# Patient Record
Sex: Female | Born: 1960 | Hispanic: Yes | Marital: Single | Smoking: Never smoker
Health system: Southern US, Community
[De-identification: ages and names within clinical notes are randomized; demographics above are authoritative.]

## PROBLEM LIST (undated history)

## (undated) DIAGNOSIS — I10 Essential (primary) hypertension: Secondary | ICD-10-CM

## (undated) HISTORY — DX: Essential (primary) hypertension: I10

---

## 2005-09-11 ENCOUNTER — Ambulatory Visit: Payer: Self-pay

## 2006-08-26 ENCOUNTER — Ambulatory Visit: Payer: Self-pay

## 2006-11-18 HISTORY — PX: BREAST BIOPSY: SHX20

## 2007-09-02 ENCOUNTER — Ambulatory Visit: Payer: Self-pay

## 2007-09-03 ENCOUNTER — Ambulatory Visit: Payer: Self-pay

## 2007-09-04 ENCOUNTER — Ambulatory Visit: Payer: Self-pay | Admitting: Unknown Physician Specialty

## 2007-09-23 ENCOUNTER — Ambulatory Visit: Payer: Self-pay | Admitting: Surgery

## 2008-10-19 ENCOUNTER — Ambulatory Visit: Payer: Self-pay

## 2009-04-19 ENCOUNTER — Ambulatory Visit: Payer: Self-pay

## 2009-10-24 ENCOUNTER — Ambulatory Visit: Payer: Self-pay

## 2010-10-24 ENCOUNTER — Ambulatory Visit: Payer: Self-pay

## 2011-12-04 ENCOUNTER — Ambulatory Visit: Payer: Self-pay

## 2012-12-08 ENCOUNTER — Ambulatory Visit: Payer: Self-pay

## 2013-12-15 ENCOUNTER — Ambulatory Visit: Payer: Self-pay

## 2014-11-18 HISTORY — PX: ABDOMINAL HYSTERECTOMY: SHX81

## 2015-01-18 ENCOUNTER — Ambulatory Visit: Payer: Self-pay

## 2015-02-02 ENCOUNTER — Ambulatory Visit: Payer: Self-pay

## 2015-03-03 ENCOUNTER — Other Ambulatory Visit: Payer: Self-pay | Admitting: Oncology

## 2015-03-03 DIAGNOSIS — N63 Unspecified lump in unspecified breast: Secondary | ICD-10-CM

## 2015-08-08 ENCOUNTER — Other Ambulatory Visit: Payer: Self-pay

## 2015-11-02 ENCOUNTER — Ambulatory Visit
Admission: RE | Admit: 2015-11-02 | Discharge: 2015-11-02 | Disposition: A | Discharge status: Home / Self Care | Payer: Self-pay | Source: Ambulatory Visit | Attending: Oncology | Admitting: Oncology

## 2015-11-02 DIAGNOSIS — N63 Unspecified lump in unspecified breast: Secondary | ICD-10-CM

## 2016-01-05 NOTE — Progress Notes (Signed)
Patient had Birads 3 mammogram in December 2016, which was a follow-up of a Birads 3 mammogram from January 18, 2015.  Patient missed scheduled appointment for September 2016.  Joellyn Quails will schedule annual [facility] appointment with diagnostic mammogram for March 2017.

## 2016-01-31 ENCOUNTER — Encounter: Payer: Self-pay | Admitting: *Deleted

## 2016-01-31 ENCOUNTER — Ambulatory Visit
Admission: RE | Admit: 2016-01-31 | Discharge: 2016-01-31 | Disposition: A | Discharge status: Home / Self Care | Payer: Self-pay | Source: Ambulatory Visit | Attending: Oncology | Admitting: Oncology

## 2016-01-31 ENCOUNTER — Ambulatory Visit: Payer: Self-pay | Attending: Oncology | Admitting: *Deleted

## 2016-01-31 VITALS — BP 126/83 | HR 71 | Temp 98.3°F | Ht <= 58 in | Wt 129.0 lb

## 2016-01-31 DIAGNOSIS — N63 Unspecified lump in unspecified breast: Secondary | ICD-10-CM

## 2016-01-31 NOTE — Progress Notes (Signed)
Subjective:     Patient ID: Tina Roberson, female   DOB: 01/27/1961, 55 y.o.   MRN: [id number]  HPI   Review of Systems     Objective:   Physical Exam  Pulmonary/Chest: Right breast exhibits inverted nipple. Right breast exhibits no mass, no nipple discharge, no skin change and no tenderness. Left breast exhibits inverted nipple. Left breast exhibits no mass, no nipple discharge, no skin change and no tenderness. Breasts are symmetrical.  Bilateral inverted nipples.  Patient states this is normal for her.       Assessment:     55 year old Hispanic [facility] 6 month follow-up mammogram of a right breast mass.  Lloyda, the interpreter present during the interview and exam.  Clinical breast exam unremarkable.  Taught self breast awareness.  Patient has been screened for eligibility.  She does not have any insurance, Medicare or Medicaid.  She also meets financial eligibility.  Hand-out given on the Affordable Care Act.    Plan:     Will order bilateral diagnostic mammogram and ultrasound for annual and 6 month follow of a right breast mass.  Will follow-up per [facility] protocol.

## 2016-01-31 NOTE — Patient Instructions (Signed)
Gave patient hand-out, Women Staying Healthy, Active and Well from [facility], with education on breast health, pap smears, heart and colon health. 

## 2016-02-09 ENCOUNTER — Encounter: Payer: Self-pay | Admitting: *Deleted

## 2016-02-09 NOTE — Progress Notes (Signed)
Letter sent for translation to inform patient of her mammogram and next appointment.

## 2016-02-12 ENCOUNTER — Encounter: Payer: Self-pay | Admitting: *Deleted

## 2016-02-12 NOTE — Progress Notes (Signed)
Translated letter mailed to inform patient of her mammogram results and her next appointment on 02/03/17 @ 8:00.  [facility] to [place].

## 2017-02-03 ENCOUNTER — Ambulatory Visit
Admission: RE | Admit: 2017-02-03 | Discharge: 2017-02-03 | Disposition: A | Discharge status: Home / Self Care | Payer: Self-pay | Source: Ambulatory Visit | Attending: Oncology | Admitting: Oncology

## 2017-02-03 ENCOUNTER — Ambulatory Visit: Payer: Self-pay | Attending: Oncology

## 2017-02-03 VITALS — BP 152/77 | HR 72 | Temp 98.3°F | Ht <= 58 in | Wt 126.3 lb

## 2017-02-03 DIAGNOSIS — N63 Unspecified lump in unspecified breast: Secondary | ICD-10-CM

## 2017-02-03 NOTE — Progress Notes (Signed)
Radiologist gave patient Birads 2 results with recommendation to return to annual screening.  Copy to [facility].

## 2017-02-03 NOTE — Progress Notes (Signed)
Subjective:     Patient ID: Tina Roberson, female   DOB: 09/12/1961, 56 y.o.   MRN: [id number]  HPI   Review of Systems     Objective:   Physical Exam  Pulmonary/Chest: Right breast exhibits inverted nipple. Right breast exhibits no mass, no nipple discharge, no skin change and no tenderness. Left breast exhibits inverted nipple. Left breast exhibits no mass, no nipple discharge, no skin change and no tenderness. Breasts are symmetrical.       Assessment:     56 year old hispanic patient returns for annual, and follow-up mammogram for Birads 3 performed on 01/31/16, stability of right breast mass.  Patient screened, and meets [facility] eligibility.  Patient does not have insurance, Medicare or Medicaid.  Handout given on Affordable Care Act.  Instructed patient on breast self-exam using teach back method.  CBE unremarkable.  No mass or lump palpated.    Plan:     Sent for bilateral diagnostic mammogram, and ultrasound of right breast.  Delos HaringLoyda Murr interpreted exam.

## 2018-03-16 ENCOUNTER — Encounter (INDEPENDENT_AMBULATORY_CARE_PROVIDER_SITE_OTHER): Payer: Self-pay

## 2018-03-16 ENCOUNTER — Ambulatory Visit: Payer: Self-pay | Attending: Oncology

## 2018-03-16 ENCOUNTER — Ambulatory Visit
Admission: RE | Admit: 2018-03-16 | Discharge: 2018-03-16 | Disposition: A | Discharge status: Home / Self Care | Payer: Self-pay | Source: Ambulatory Visit | Attending: Oncology | Admitting: Oncology

## 2018-03-16 VITALS — Ht <= 58 in | Wt 127.0 lb

## 2018-03-16 DIAGNOSIS — Z0000 Encounter for general adult medical examination without abnormal findings: Secondary | ICD-10-CM

## 2018-03-16 NOTE — Progress Notes (Signed)
Letter mailed from [facility] to notify of normal mammogram results.  Patient to return in one year for annual screening.Sent for bilateral screening mammogram.  Copy to [facility].

## 2018-03-16 NOTE — Progress Notes (Signed)
  Subjective:     Patient ID: Tina Roberson, female   DOB: 05-05-1961, 57 y.o.   MRN: [id number]  HPI   Review of Systems     Objective:   Physical Exam  Pulmonary/Chest: Right breast exhibits no inverted nipple, no mass, no nipple discharge, no skin change and no tenderness. Left breast exhibits no inverted nipple, no mass, no nipple discharge, no skin change and no tenderness. Breasts are symmetrical.       Assessment:     57 year old hispanic patient presents for [facility] clinic visit.  Delos Haring interpreted exam.  Patient screened, and meets [facility] eligibility.  Patient does not have insurance, Medicare or Medicaid.  Handout given on Affordable Care Act.  Instructed patient on breast self awareness using teach back method.  Clinical breast exam unremarkable.  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  Unable to palpated mass or lump today.    Plan:     Sent for bilateral screening mammogram.

## 2019-05-18 ENCOUNTER — Other Ambulatory Visit: Payer: Self-pay

## 2019-06-29 ENCOUNTER — Encounter (INDEPENDENT_AMBULATORY_CARE_PROVIDER_SITE_OTHER): Payer: Self-pay

## 2019-06-29 ENCOUNTER — Ambulatory Visit
Admission: RE | Admit: 2019-06-29 | Discharge: 2019-06-29 | Disposition: A | Discharge status: Home / Self Care | Payer: Self-pay | Source: Ambulatory Visit | Attending: Oncology | Admitting: Oncology

## 2019-06-29 ENCOUNTER — Ambulatory Visit: Payer: Self-pay | Attending: Oncology

## 2019-06-29 ENCOUNTER — Other Ambulatory Visit: Payer: Self-pay

## 2019-06-29 VITALS — BP 134/81 | HR 71 | Temp 98.7°F

## 2019-06-29 DIAGNOSIS — Z0000 Encounter for general adult medical examination without abnormal findings: Secondary | ICD-10-CM

## 2019-06-29 NOTE — Progress Notes (Signed)
  Subjective:     Patient ID: Tina Roberson, female   DOB: [birth year], 58 y.o.   MRN: [id number]  HPI   Review of Systems     Objective:   Physical Exam Chest:     Breasts: Breasts are asymmetrical.        Right: Inverted nipple present. No swelling, bleeding, mass, nipple discharge, skin change or tenderness.        Left: Inverted nipple present. No swelling, bleeding, mass, nipple discharge, skin change or tenderness.     Comments: Right breast larger than left; bilateral nipple inversion       Assessment:     58 year old hispanic patient returns for [facility] clinic visit.  Erich Montane interprets exam.  Virtual interpreter Cletus Gash # [id number] Patient screened, and meets [facility] eligibility.  Patient does not have insurance, Medicare or Medicaid. Instructed patient on breast self awareness using teach back method.  Clinical breast exam unremarkable.  No mass or lump palpated.  History of stable right breast mass and cyst;  Hysterectomy.    Plan:     Sent for bilateral screening mammogram.

## 2019-07-01 NOTE — Progress Notes (Signed)
Letter mailed from [facility] to notify of normal mammogram results.  Patient to return in one year for annual screening.  Copy to [facility]. 

## 2020-06-06 ENCOUNTER — Ambulatory Visit: Payer: Self-pay

## 2020-06-20 ENCOUNTER — Other Ambulatory Visit: Payer: Self-pay

## 2020-06-20 ENCOUNTER — Ambulatory Visit: Payer: Self-pay | Attending: Oncology | Admitting: *Deleted

## 2020-06-20 ENCOUNTER — Ambulatory Visit
Admission: RE | Admit: 2020-06-20 | Discharge: 2020-06-20 | Disposition: A | Discharge status: Home / Self Care | Payer: Self-pay | Source: Ambulatory Visit | Attending: Oncology | Admitting: Oncology

## 2020-06-20 ENCOUNTER — Encounter: Payer: Self-pay | Admitting: *Deleted

## 2020-06-20 VITALS — BP 131/57 | HR 78 | Temp 97.8°F | Resp 20 | Ht <= 58 in | Wt 130.0 lb

## 2020-06-20 DIAGNOSIS — Z0000 Encounter for general adult medical examination without abnormal findings: Secondary | ICD-10-CM

## 2020-06-20 NOTE — Progress Notes (Signed)
  Subjective:     Patient ID: Tina Roberson, female   DOB: [birth year], 59 y.o.   MRN: [id number]  HPI   Review of Systems     Objective:   Physical Exam Chest:     Breasts:        Right: Inverted nipple present. No swelling, bleeding, mass, nipple discharge, skin change or tenderness.        Left: Inverted nipple present. No swelling, bleeding, mass, nipple discharge, skin change or tenderness.    Lymphadenopathy:     Upper Body:     Right upper body: No supraclavicular or axillary adenopathy.     Left upper body: No supraclavicular or axillary adenopathy.        Assessment:     59 year old Hispanic female returns to [facility] for annual screening.  Tina Roberson, the interpreter present during the interview and exam.  Taught self breast awareness.  Patient with a history of hysterectomy.  Pap omitted per protocol. Patient has been screened for eligibility.  She does not have any insurance, Medicare or Medicaid.  She also meets financial eligibility.   Risk Assessment    Risk Scores      06/20/2020 06/29/2019   Last edited by: Tina Roberson, CMA Tina Like, RN   5-year risk:     Lifetime risk:              Plan:     Screening mammogram ordered.  Will follow up per [facility] protocol.

## 2020-06-20 NOTE — Patient Instructions (Signed)
Gave patient hand-out, Women Staying Healthy, Active and Well from [facility], with education on breast health, pap smears, heart and colon health. 

## 2020-06-22 ENCOUNTER — Encounter: Payer: Self-pay | Admitting: *Deleted

## 2020-06-22 NOTE — Progress Notes (Signed)
Letter mailed from [facility] to inform patient of her normal mammogram results.  Patient is to follow-up with annual screening in one year. 

## 2021-07-10 ENCOUNTER — Other Ambulatory Visit: Payer: Self-pay

## 2021-07-10 ENCOUNTER — Ambulatory Visit
Admission: RE | Admit: 2021-07-10 | Discharge: 2021-07-10 | Disposition: A | Discharge status: Home / Self Care | Payer: Self-pay | Source: Ambulatory Visit | Attending: Oncology | Admitting: Oncology

## 2021-07-10 ENCOUNTER — Ambulatory Visit: Payer: Self-pay | Attending: Oncology

## 2021-07-10 VITALS — BP 155/58 | HR 97 | Temp 98.1°F | Ht <= 58 in | Wt 120.8 lb

## 2021-07-10 DIAGNOSIS — Z0000 Encounter for general adult medical examination without abnormal findings: Secondary | ICD-10-CM

## 2021-07-10 NOTE — Progress Notes (Signed)
  Subjective:     Patient ID: Tina Roberson, female   DOB: August 06, 1961, 60 y.o.   MRN: [id number]  HPI   Review of Systems     Objective:   Physical Exam Chest:  Breasts:    Right: No swelling, bleeding, inverted nipple, mass, nipple discharge, skin change or tenderness.     Left: No swelling, bleeding, inverted nipple, mass, nipple discharge, skin change or tenderness.       Assessment:     60 year old Hispanic patient returns for [facility] screening. Patient screened, and meets [facility] eligibility. Derek Mound # [id number] from [facility] interpreted exam. Patient does not have insurance, Medicare or Medicaid.  Instructed patient on breast self awareness using teach back method Clinical breast exam unremarkable.  Risk Assessment     Risk Scores       07/10/2021 06/20/2020   Last edited by: [name], RN Neita Garnet, CMA   5-year risk: 0.8 %    Lifetime risk: 4.1 %                Plan:     Sent for bilateral screening mammogram.

## 2021-07-16 NOTE — Progress Notes (Signed)
Letter mailed from [facility] to notify of normal mammogram results.  Patient to return in one year for annual screening.  Copy to [facility]. 

## 2021-12-18 IMAGING — MG MM DIGITAL SCREENING BILAT W/ TOMO AND CAD
6 of 10 series · 6 of 30 positions shown · non-contrast
Comparison: None.

ACR Breast Density Category a: The breast tissue is almost entirely
fatty.

CLINICAL DATA: Screening.

EXAM:
DIGITAL SCREENING BILATERAL MAMMOGRAM WITH TOMOSYNTHESIS AND CAD
TECHNIQUE: Bilateral screening digital craniocaudal and mediolateral oblique
mammograms were obtained. Bilateral screening digital breast
tomosynthesis was performed. The images were evaluated with
computer-aided detection.

[L CC synth-2D]
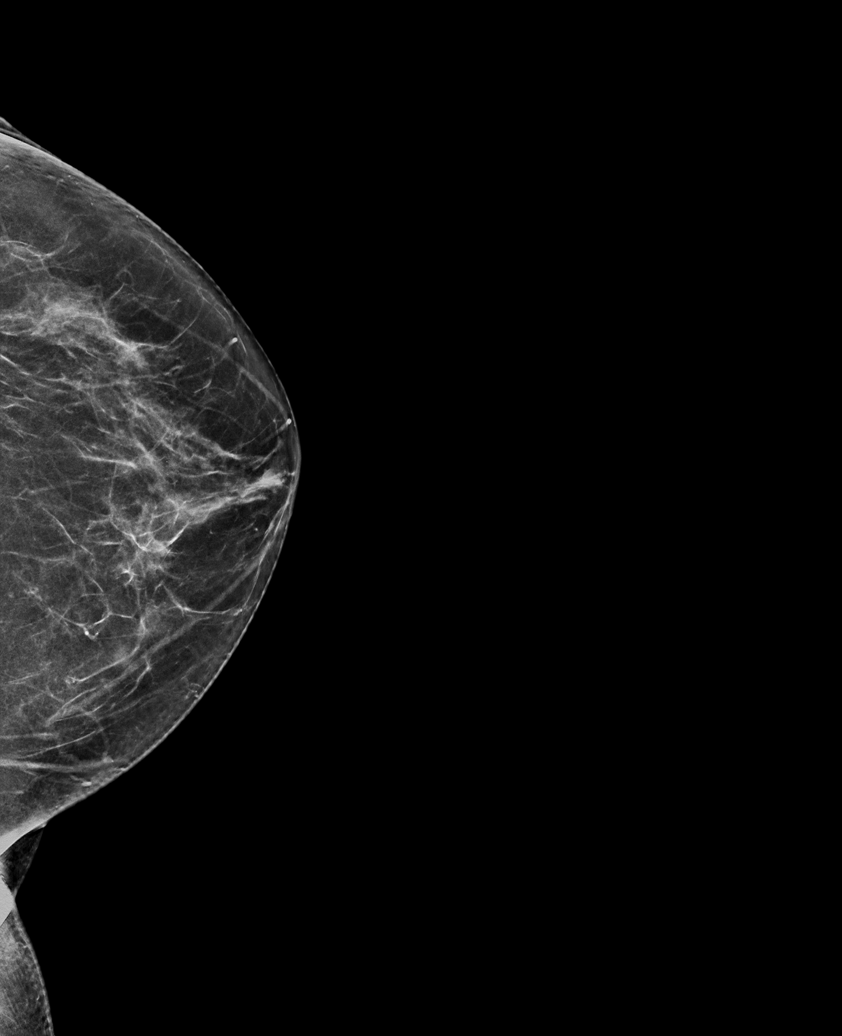

[L MLO synth-2D (1 of 2)]
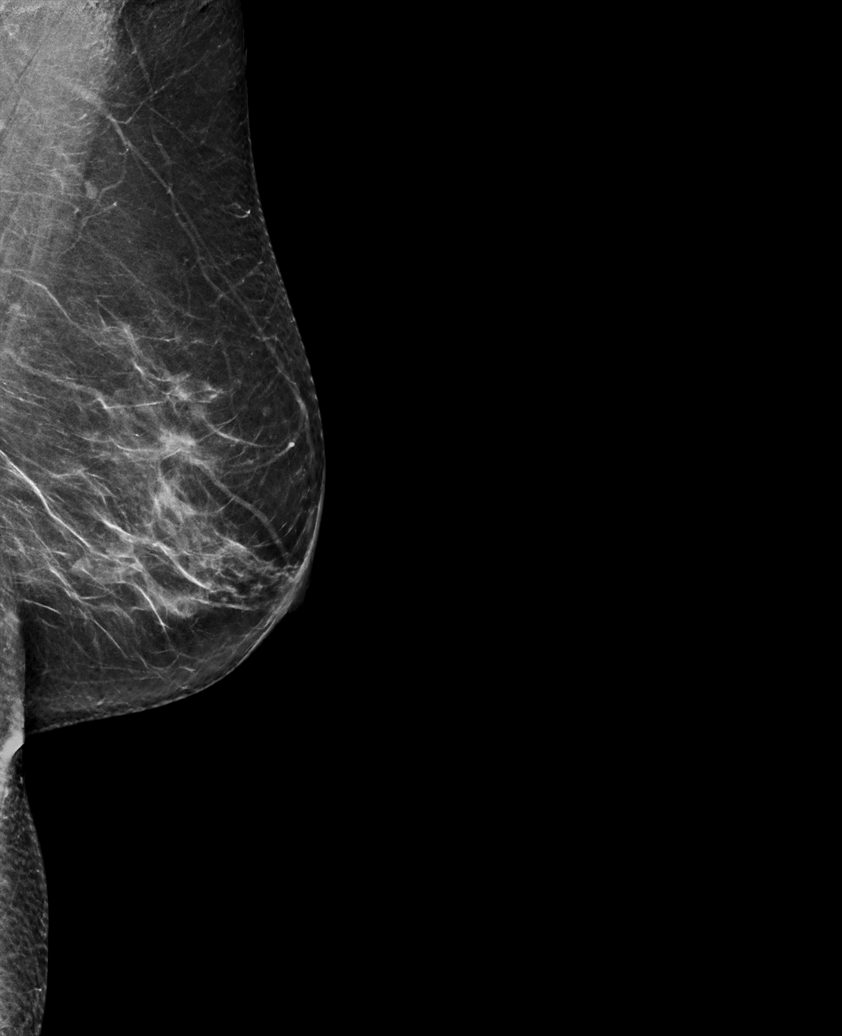

[R CC synth-2D]
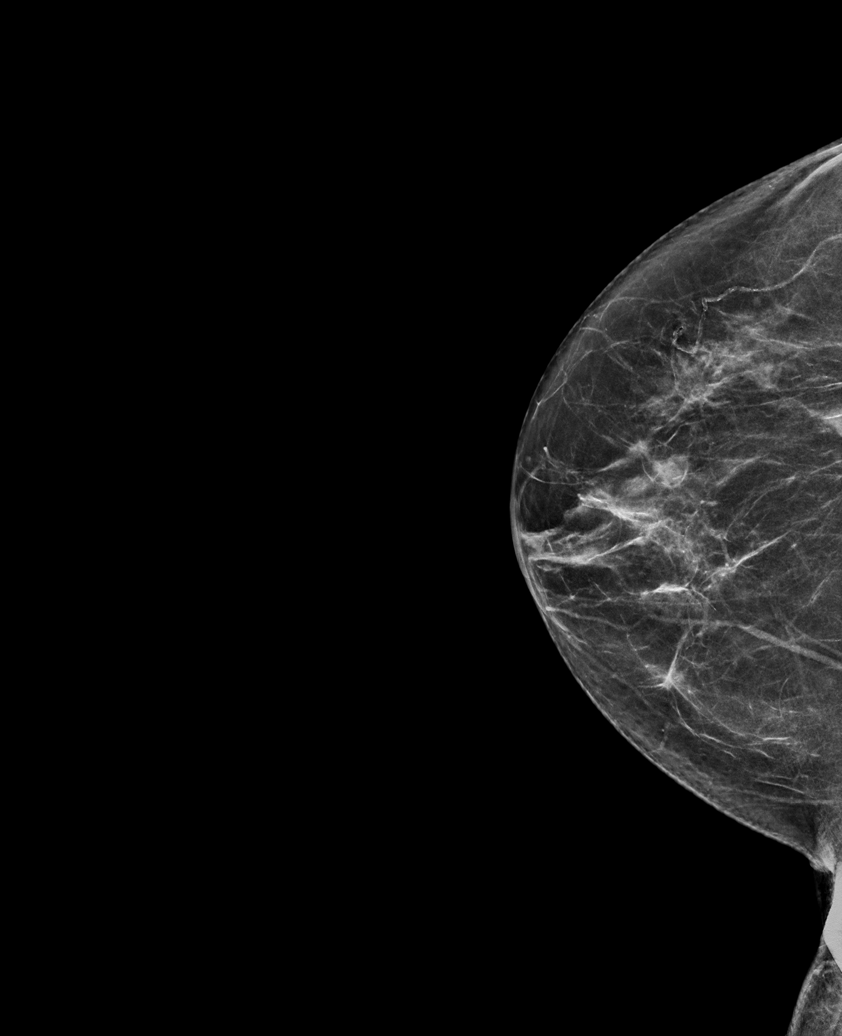

[R MLO synth-2D]
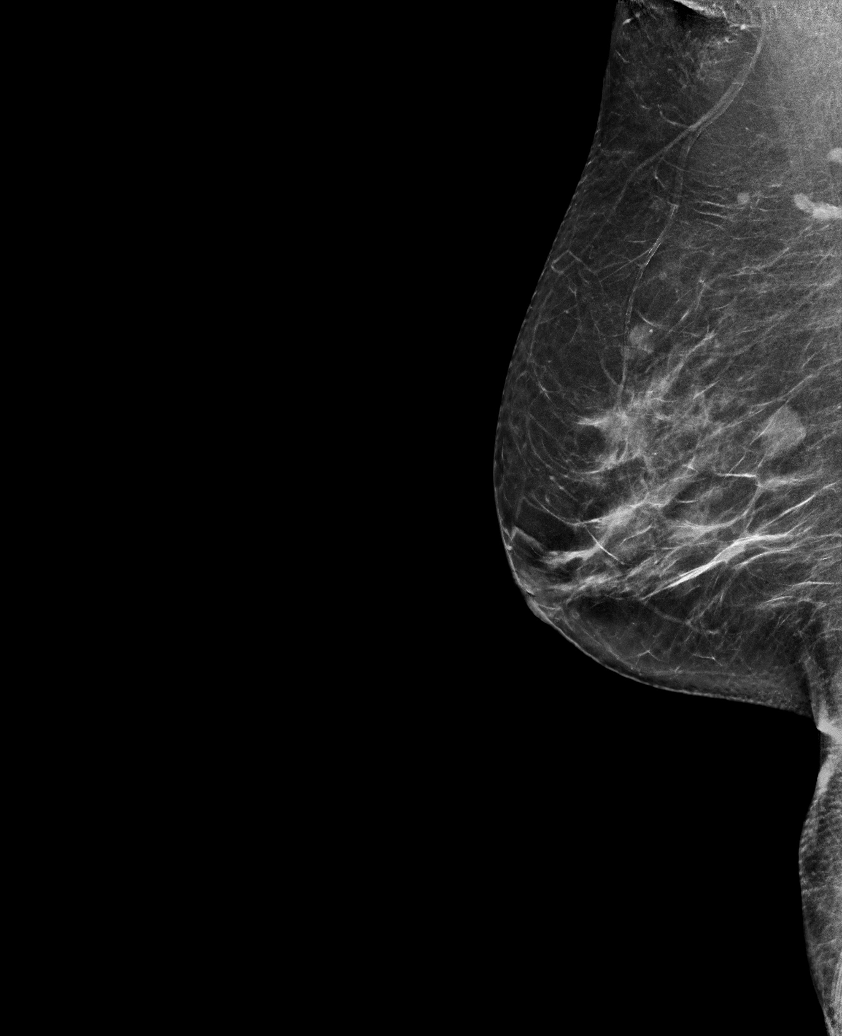

[L MLO synth-2D (2 of 2)]
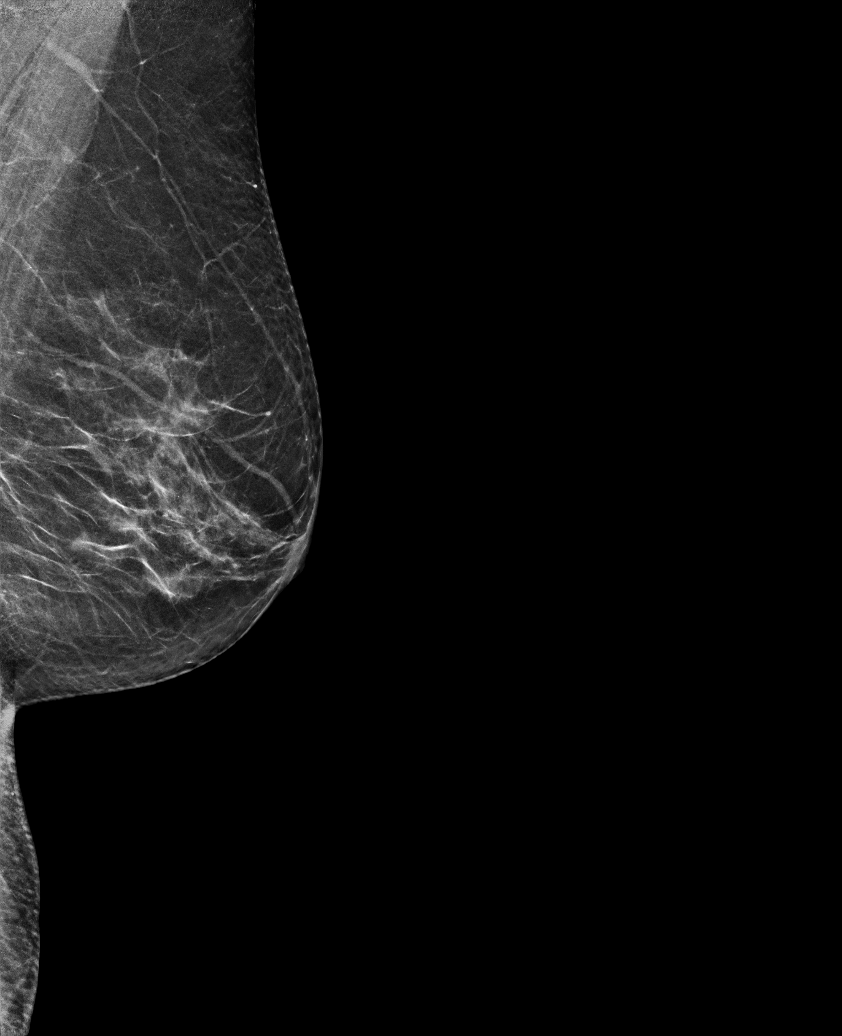

[L MLO tomo · tomo slice 33/64.0]
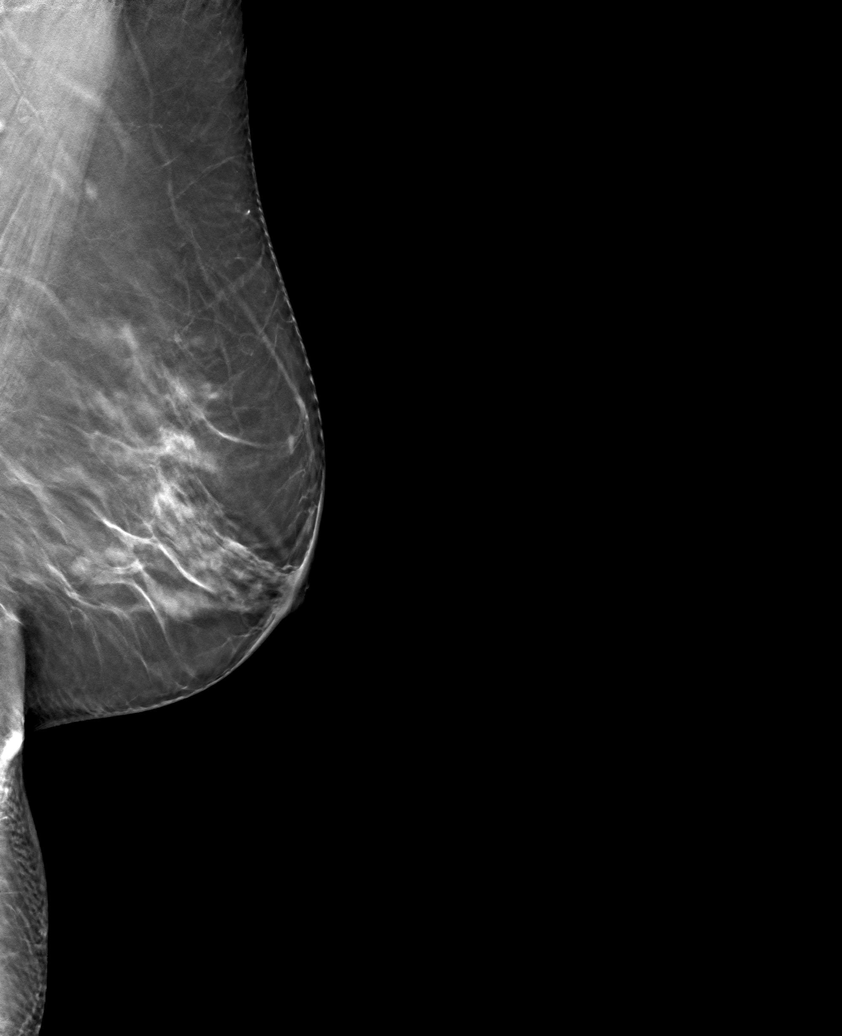

[6 of 30 positions shown; findings below may reference images not displayed]

FINDINGS: There are no findings suspicious for malignancy.
IMPRESSION: No mammographic evidence of malignancy. A result letter of this
screening mammogram will be mailed directly to the patient.

RECOMMENDATION:
Screening mammogram in one year. (Code:44-M-M6Q)

BI-RADS CATEGORY  1: Negative.

## 2023-04-07 ENCOUNTER — Encounter: Payer: Self-pay | Admitting: Internal Medicine

## 2023-04-08 ENCOUNTER — Other Ambulatory Visit: Payer: Self-pay

## 2023-04-08 DIAGNOSIS — Z1231 Encounter for screening mammogram for malignant neoplasm of breast: Secondary | ICD-10-CM

## 2023-05-12 ENCOUNTER — Ambulatory Visit
Admission: RE | Admit: 2023-05-12 | Discharge: 2023-05-12 | Disposition: A | Discharge status: Home / Self Care | Payer: Self-pay | Source: Ambulatory Visit | Attending: Obstetrics and Gynecology | Admitting: Obstetrics and Gynecology

## 2023-05-12 ENCOUNTER — Ambulatory Visit: Payer: Self-pay | Attending: Hematology and Oncology | Admitting: Hematology and Oncology

## 2023-05-12 VITALS — BP 147/64 | Wt 130.1 lb

## 2023-05-12 DIAGNOSIS — Z1231 Encounter for screening mammogram for malignant neoplasm of breast: Secondary | ICD-10-CM

## 2023-05-12 NOTE — Progress Notes (Signed)
[name] is a 62 y.o. female who presents to [facility] clinic today with no complaints.    Pap Smear: Pap not smear completed today due to benign hysterectomy.   Physical exam: Breasts Breasts symmetrical. No skin abnormalities bilateral breasts. No nipple retraction bilateral breasts. No nipple discharge bilateral breasts. No lymphadenopathy. No lumps palpated bilateral breasts.  MS DIGITAL SCREENING TOMO BILATERAL  Result Date: 07/11/2021 CLINICAL DATA:  Screening. EXAM: DIGITAL SCREENING BILATERAL MAMMOGRAM WITH TOMOSYNTHESIS AND CAD TECHNIQUE: Bilateral screening digital craniocaudal and mediolateral oblique mammograms were obtained. Bilateral screening digital breast tomosynthesis was performed. The images were evaluated with computer-aided detection. COMPARISON:  None. ACR Breast Density Category a: The breast tissue is almost entirely fatty. FINDINGS: There are no findings suspicious for malignancy. IMPRESSION: No mammographic evidence of malignancy. A result letter of this screening mammogram will be mailed directly to the patient. RECOMMENDATION: Screening mammogram in one year. (Code:SM-B-01Y) BI-RADS CATEGORY  1: Negative. Electronically Signed   By: Norva Pavlov M.D.   On: 07/11/2021 15:33  MS DIGITAL SCREENING TOMO BILATERAL  Result Date: 06/21/2020 CLINICAL DATA:  Screening. EXAM: DIGITAL SCREENING BILATERAL MAMMOGRAM WITH TOMO AND CAD COMPARISON:  Previous exam(s). ACR Breast Density Category b: There are scattered areas of fibroglandular density. FINDINGS: There are no findings suspicious for malignancy. Images were processed with CAD. IMPRESSION: No mammographic evidence of malignancy. A result letter of this screening mammogram will be mailed directly to the patient. RECOMMENDATION: Screening mammogram in one year. (Code:SM-B-01Y) BI-RADS CATEGORY  1: Negative. Electronically Signed   By: Baird Lyons M.D.   On: 06/21/2020 16:29   MS DIGITAL SCREENING TOMO  BILATERAL  Result Date: 06/29/2019 CLINICAL DATA:  Screening. EXAM: DIGITAL SCREENING BILATERAL MAMMOGRAM WITH TOMO AND CAD COMPARISON:  Previous exam(s). ACR Breast Density Category c: The breast tissue is heterogeneously dense, which may obscure small masses. FINDINGS: There are no findings suspicious for malignancy. Images were processed with CAD. IMPRESSION: No mammographic evidence of malignancy. A result letter of this screening mammogram will be mailed directly to the patient. RECOMMENDATION: Screening mammogram in one year. (Code:SM-B-01Y) BI-RADS CATEGORY  1: Negative. Electronically Signed   By: Ted Mcalpine M.D.   On: 06/29/2019 16:16         Pelvic/Bimanual Pap is not indicated today    Smoking History: Patient has never smoked and was not referred to quit line.    Patient Navigation: Patient education provided. Access to services provided for patient through [facility] program. Delos Haring interpreter provided. No transportation provided   Colorectal Cancer Screening: Per patient had colonoscopy in 2022 with benign results and recommendation for 10 year follow up.  No complaints today.    Breast and Cervical Cancer Risk Assessment: Patient does not have family history of breast cancer, known genetic mutations, or radiation treatment to the chest before age 98. Patient does not have history of cervical dysplasia, immunocompromised, or DES exposure in-utero.  Risk Assessment   No risk assessment data for the current encounter  Risk Scores       07/10/2021   Last edited by: [name], RN   5-year risk: 0.8 %   Lifetime risk: 4.1 %            A: [facility] exam without pap smear No complaints with benign exam.   P: Referred patient to the [facility] for a screening mammogram. Appointment scheduled 05/12/23.  Pascal Lux, NP 05/12/2023 1:46 PM

## 2023-05-12 NOTE — Patient Instructions (Signed)
Taught Tina Roberson about self breast awareness and gave educational materials to take home. Patient did not need a Pap smear today due to benign hysterectomy. Referred patient to the [facility] for screening mammogram. Appointment scheduled for 05/12/23. Patient aware of appointment and will be there. Let patient know will follow up with her within the next couple weeks with results. Tina Roberson verbalized understanding.  Pascal Lux, NP 1:48 PM

## 2024-04-13 ENCOUNTER — Telehealth: Payer: Self-pay | Admitting: *Deleted

## 2024-04-22 ENCOUNTER — Other Ambulatory Visit: Payer: Self-pay | Admitting: Primary Care

## 2024-04-22 DIAGNOSIS — Z1231 Encounter for screening mammogram for malignant neoplasm of breast: Secondary | ICD-10-CM

## 2024-05-12 ENCOUNTER — Ambulatory Visit
Admission: RE | Admit: 2024-05-12 | Discharge: 2024-05-12 | Disposition: A | Discharge status: Home / Self Care | Payer: Self-pay | Source: Ambulatory Visit | Attending: Primary Care | Admitting: Primary Care

## 2024-05-12 DIAGNOSIS — Z1231 Encounter for screening mammogram for malignant neoplasm of breast: Secondary | ICD-10-CM | POA: Insufficient documentation
